# Patient Record
Sex: Female | Born: 1975 | Race: Black or African American | Hispanic: No | Marital: Single | Smoking: Current every day smoker
Health system: Southern US, Community
[De-identification: ages and names within clinical notes are randomized; demographics above are authoritative.]

## PROBLEM LIST (undated history)

## (undated) DIAGNOSIS — J020 Streptococcal pharyngitis: Secondary | ICD-10-CM

## (undated) DIAGNOSIS — R51 Headache: Secondary | ICD-10-CM

## (undated) DIAGNOSIS — D649 Anemia, unspecified: Secondary | ICD-10-CM

## (undated) DIAGNOSIS — Z8619 Personal history of other infectious and parasitic diseases: Secondary | ICD-10-CM

## (undated) DIAGNOSIS — R519 Headache, unspecified: Secondary | ICD-10-CM

## (undated) HISTORY — PX: GASTRIC BYPASS: SHX52

## (undated) HISTORY — DX: Headache, unspecified: R51.9

## (undated) HISTORY — PX: TONSILLECTOMY: SUR1361

## (undated) HISTORY — DX: Anemia, unspecified: D64.9

## (undated) HISTORY — DX: Headache: R51

## (undated) HISTORY — PX: OVARIAN CYST SURGERY: SHX726

## (undated) HISTORY — DX: Personal history of other infectious and parasitic diseases: Z86.19

---

## 1995-07-05 HISTORY — PX: ECTOPIC PREGNANCY SURGERY: SHX613

## 2004-07-04 HISTORY — PX: CHOLECYSTECTOMY: SHX55

## 2005-04-21 HISTORY — PX: GASTRIC BYPASS: SHX52

## 2006-01-10 ENCOUNTER — Emergency Department (HOSPITAL_COMMUNITY): Admission: EM | Admit: 2006-01-10 | Discharge: 2006-01-10 | Payer: Self-pay | Admitting: Emergency Medicine

## 2006-03-14 ENCOUNTER — Emergency Department (HOSPITAL_COMMUNITY): Admission: EM | Admit: 2006-03-14 | Discharge: 2006-03-14 | Payer: Self-pay | Admitting: Emergency Medicine

## 2007-05-19 ENCOUNTER — Emergency Department (HOSPITAL_COMMUNITY): Admission: EM | Admit: 2007-05-19 | Discharge: 2007-05-19 | Payer: Self-pay | Admitting: Family Medicine

## 2007-07-02 ENCOUNTER — Emergency Department (HOSPITAL_COMMUNITY): Admission: EM | Admit: 2007-07-02 | Discharge: 2007-07-02 | Payer: Self-pay | Admitting: Emergency Medicine

## 2007-11-06 ENCOUNTER — Emergency Department (HOSPITAL_COMMUNITY): Admission: EM | Admit: 2007-11-06 | Discharge: 2007-11-06 | Payer: Self-pay | Admitting: Family Medicine

## 2007-11-22 ENCOUNTER — Emergency Department (HOSPITAL_COMMUNITY): Admission: EM | Admit: 2007-11-22 | Discharge: 2007-11-22 | Payer: Self-pay | Admitting: Family Medicine

## 2007-12-18 ENCOUNTER — Inpatient Hospital Stay (HOSPITAL_COMMUNITY): Admission: AD | Admit: 2007-12-18 | Discharge: 2007-12-18 | Payer: Self-pay | Admitting: Family Medicine

## 2008-01-08 ENCOUNTER — Inpatient Hospital Stay (HOSPITAL_COMMUNITY): Admission: AD | Admit: 2008-01-08 | Discharge: 2008-01-08 | Payer: Self-pay | Admitting: Obstetrics & Gynecology

## 2008-02-06 ENCOUNTER — Inpatient Hospital Stay (HOSPITAL_COMMUNITY): Admission: AD | Admit: 2008-02-06 | Discharge: 2008-02-06 | Payer: Self-pay | Admitting: Obstetrics and Gynecology

## 2008-04-24 ENCOUNTER — Ambulatory Visit: Payer: Self-pay | Admitting: Hematology and Oncology

## 2008-04-30 LAB — CBC & DIFF AND RETIC
Eosinophils Absolute: 0.3 10*3/uL (ref 0.0–0.5)
HCT: 26 % — ABNORMAL LOW (ref 34.8–46.6)
LYMPH%: 20.2 % (ref 14.0–48.0)
MCHC: 33.4 g/dL (ref 32.0–36.0)
MCV: 84.4 fL (ref 81.0–101.0)
MONO#: 0.5 10*3/uL (ref 0.1–0.9)
MONO%: 5.4 % (ref 0.0–13.0)
NEUT#: 6.4 10*3/uL (ref 1.5–6.5)
NEUT%: 70.4 % (ref 39.6–76.8)
Platelets: 295 10*3/uL (ref 145–400)
WBC: 9.1 10*3/uL (ref 3.9–10.0)

## 2008-04-30 LAB — URINALYSIS, MICROSCOPIC - CHCC
Bilirubin (Urine): NEGATIVE
Glucose: NEGATIVE g/dL
Ketones: NEGATIVE mg/dL
Leukocyte Esterase: NEGATIVE
Nitrite: NEGATIVE
Protein: 30 mg/dL
Specific Gravity, Urine: 1.03 (ref 1.003–1.035)

## 2008-04-30 LAB — MORPHOLOGY

## 2008-05-05 ENCOUNTER — Observation Stay (HOSPITAL_COMMUNITY): Admission: AD | Admit: 2008-05-05 | Discharge: 2008-05-05 | Payer: Self-pay | Admitting: Obstetrics and Gynecology

## 2008-05-05 LAB — COMPREHENSIVE METABOLIC PANEL
Albumin: 3.8 g/dL (ref 3.5–5.2)
BUN: 13 mg/dL (ref 6–23)
CO2: 20 mEq/L (ref 19–32)
Calcium: 8.9 mg/dL (ref 8.4–10.5)
Glucose, Bld: 109 mg/dL — ABNORMAL HIGH (ref 70–99)
Potassium: 4.1 mEq/L (ref 3.5–5.3)
Sodium: 136 mEq/L (ref 135–145)
Total Protein: 7 g/dL (ref 6.0–8.3)

## 2008-05-05 LAB — LACTATE DEHYDROGENASE: LDH: 136 U/L (ref 94–250)

## 2008-05-05 LAB — HAPTOGLOBIN: Haptoglobin: 177 mg/dL (ref 16–200)

## 2008-05-05 LAB — IRON AND TIBC: Iron: 66 ug/dL (ref 42–145)

## 2008-05-05 LAB — DIRECT ANTIGLOBULIN TEST (NOT AT ARMC): DAT (Complement): NEGATIVE

## 2008-05-06 ENCOUNTER — Inpatient Hospital Stay (HOSPITAL_COMMUNITY): Admission: AD | Admit: 2008-05-06 | Discharge: 2008-05-06 | Payer: Self-pay | Admitting: Obstetrics & Gynecology

## 2008-05-07 ENCOUNTER — Inpatient Hospital Stay (HOSPITAL_COMMUNITY): Admission: AD | Admit: 2008-05-07 | Discharge: 2008-05-07 | Payer: Self-pay | Admitting: Obstetrics and Gynecology

## 2008-05-19 ENCOUNTER — Inpatient Hospital Stay (HOSPITAL_COMMUNITY): Admission: AD | Admit: 2008-05-19 | Discharge: 2008-05-19 | Payer: Self-pay | Admitting: Obstetrics and Gynecology

## 2008-05-22 ENCOUNTER — Ambulatory Visit (HOSPITAL_COMMUNITY): Admission: RE | Admit: 2008-05-22 | Discharge: 2008-05-22 | Payer: Self-pay | Admitting: Obstetrics and Gynecology

## 2008-05-26 ENCOUNTER — Inpatient Hospital Stay (HOSPITAL_COMMUNITY): Admission: AD | Admit: 2008-05-26 | Discharge: 2008-05-26 | Payer: Self-pay | Admitting: Obstetrics and Gynecology

## 2008-05-26 ENCOUNTER — Encounter: Payer: Self-pay | Admitting: Obstetrics and Gynecology

## 2008-06-05 ENCOUNTER — Ambulatory Visit (HOSPITAL_COMMUNITY): Admission: RE | Admit: 2008-06-05 | Discharge: 2008-06-05 | Payer: Self-pay | Admitting: Obstetrics and Gynecology

## 2008-06-13 ENCOUNTER — Ambulatory Visit (HOSPITAL_COMMUNITY): Admission: RE | Admit: 2008-06-13 | Discharge: 2008-06-13 | Payer: Self-pay | Admitting: Obstetrics and Gynecology

## 2008-06-16 ENCOUNTER — Ambulatory Visit (HOSPITAL_COMMUNITY): Admission: RE | Admit: 2008-06-16 | Discharge: 2008-06-16 | Payer: Self-pay | Admitting: Obstetrics and Gynecology

## 2008-06-19 ENCOUNTER — Ambulatory Visit (HOSPITAL_COMMUNITY): Admission: RE | Admit: 2008-06-19 | Discharge: 2008-06-19 | Payer: Self-pay | Admitting: Obstetrics and Gynecology

## 2008-06-24 ENCOUNTER — Inpatient Hospital Stay (HOSPITAL_COMMUNITY): Admission: AD | Admit: 2008-06-24 | Discharge: 2008-06-27 | Payer: Self-pay | Admitting: Obstetrics & Gynecology

## 2008-06-24 ENCOUNTER — Ambulatory Visit: Payer: Self-pay | Admitting: Hematology and Oncology

## 2008-06-28 ENCOUNTER — Encounter: Admission: RE | Admit: 2008-06-28 | Discharge: 2008-07-28 | Payer: Self-pay | Admitting: Obstetrics & Gynecology

## 2008-07-29 ENCOUNTER — Encounter: Admission: RE | Admit: 2008-07-29 | Discharge: 2008-08-06 | Payer: Self-pay | Admitting: Obstetrics & Gynecology

## 2008-12-11 IMAGING — CR DG CERVICAL SPINE COMPLETE 4+V
8 series · 8 of 8 positions shown · non-contrast
Comparison: None

CLINICAL DATA: Trauma

CERVICAL SPINE - 6 VIEW

[w c-spine lat]
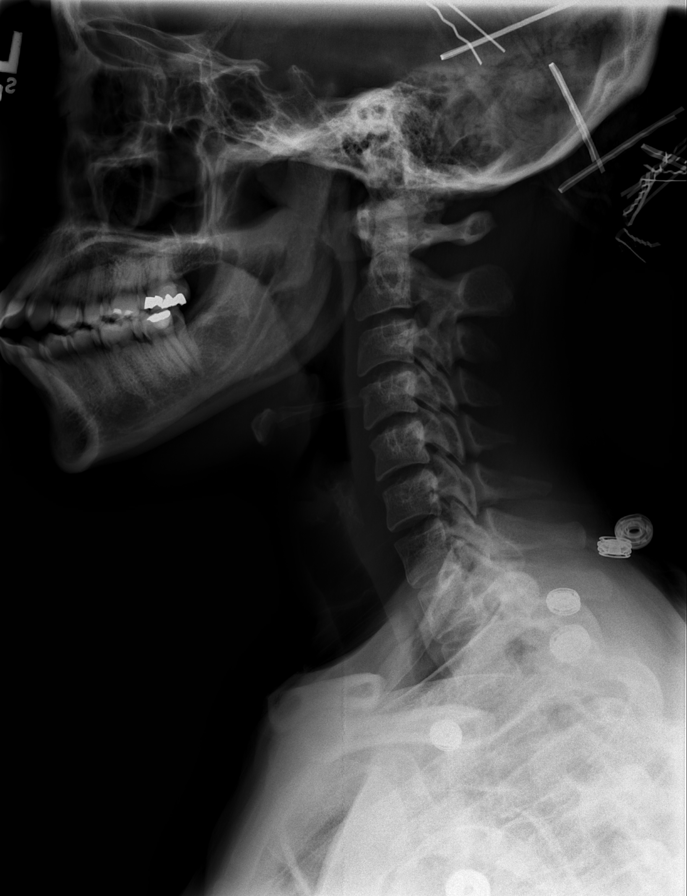

[w c-spine oblique (1 of 3)]
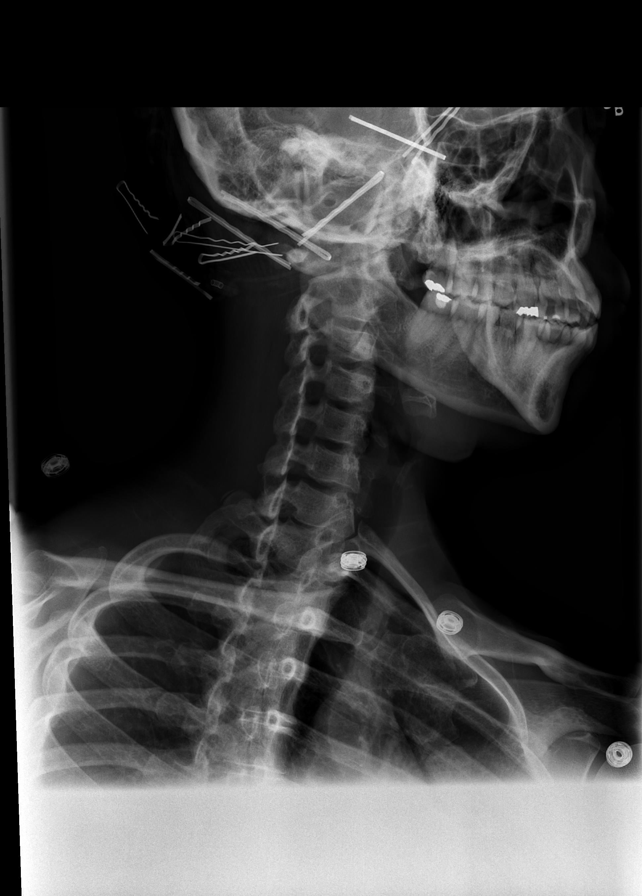

[w c-spine oblique (2 of 3)]
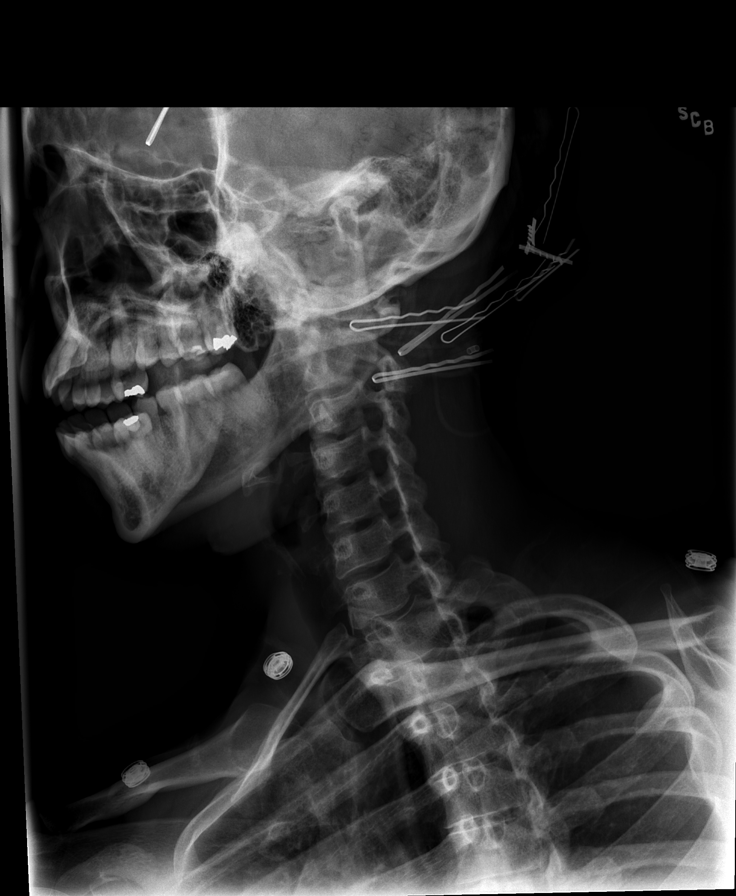

[w c-spine oblique (3 of 3)]
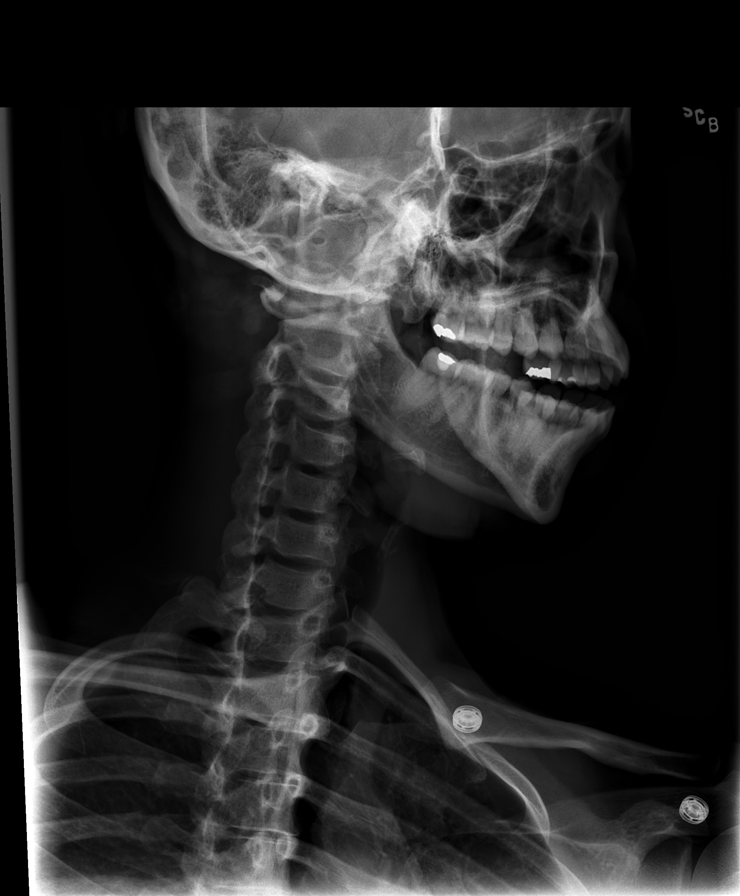

[w c-spine a.p. (1 of 2)]
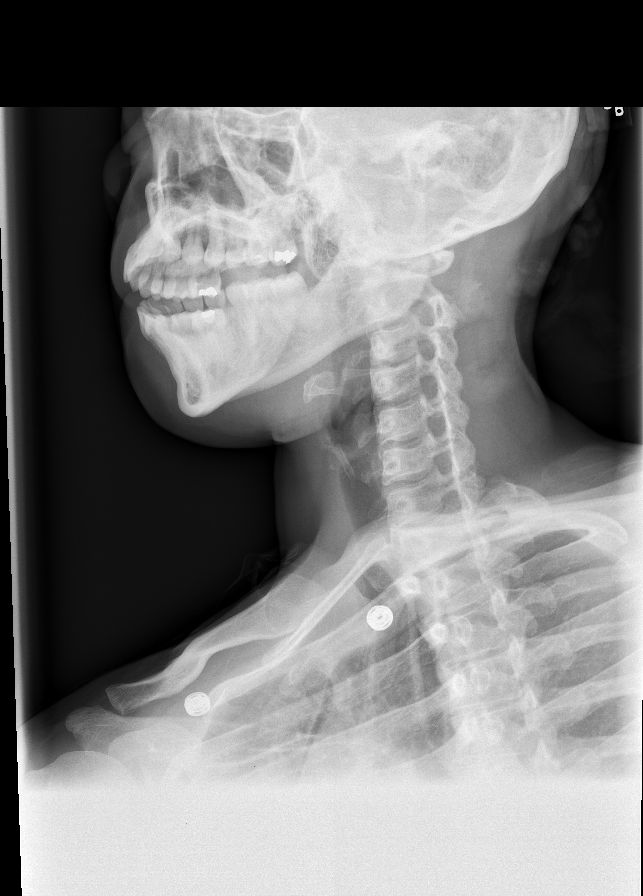

[w c-spine a.p. (2 of 2)]
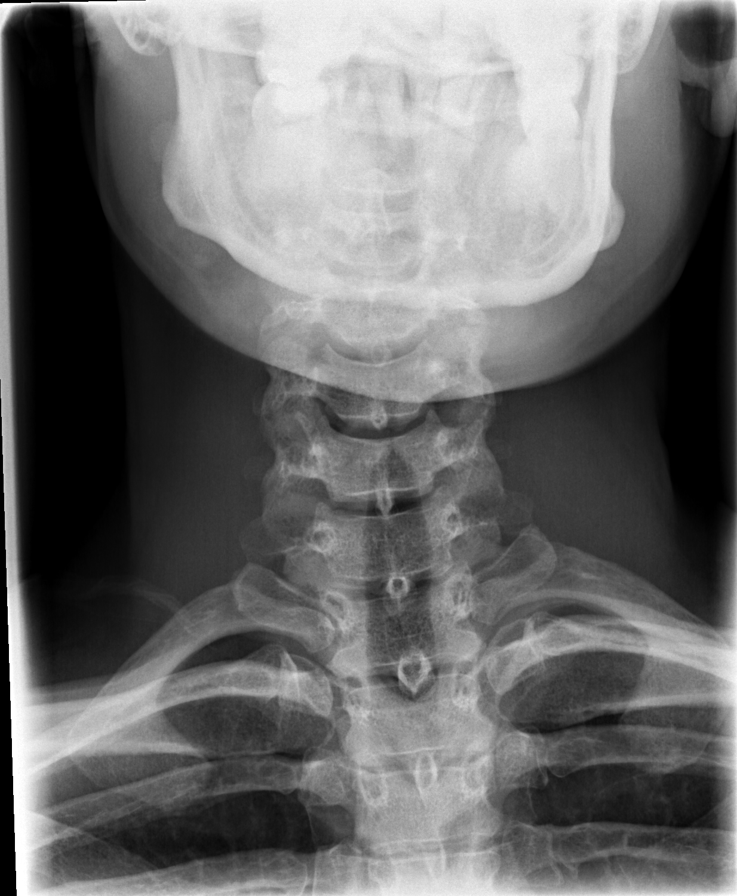

[w c-spine odontoid (1 of 2)]
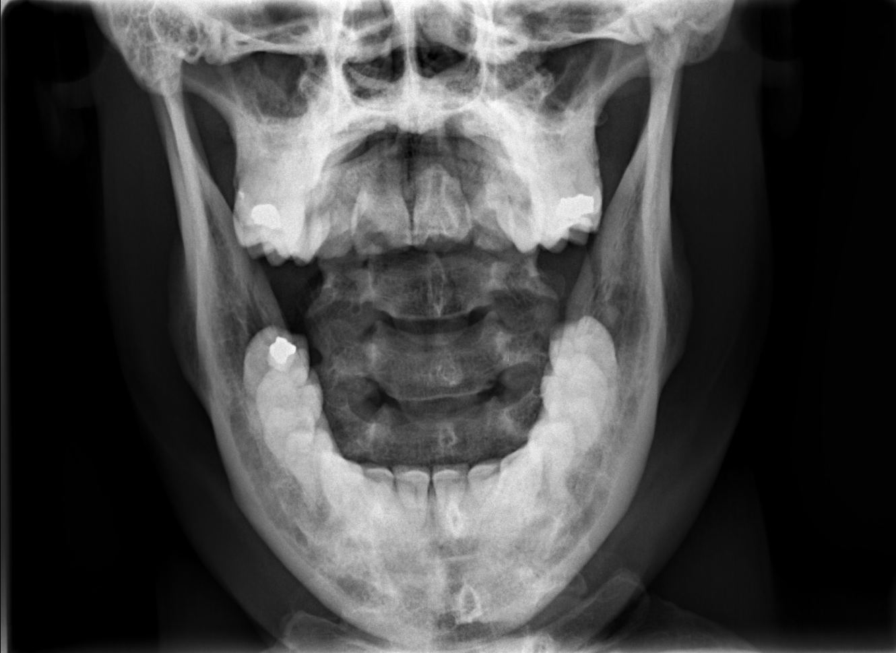

[w c-spine odontoid (2 of 2)]
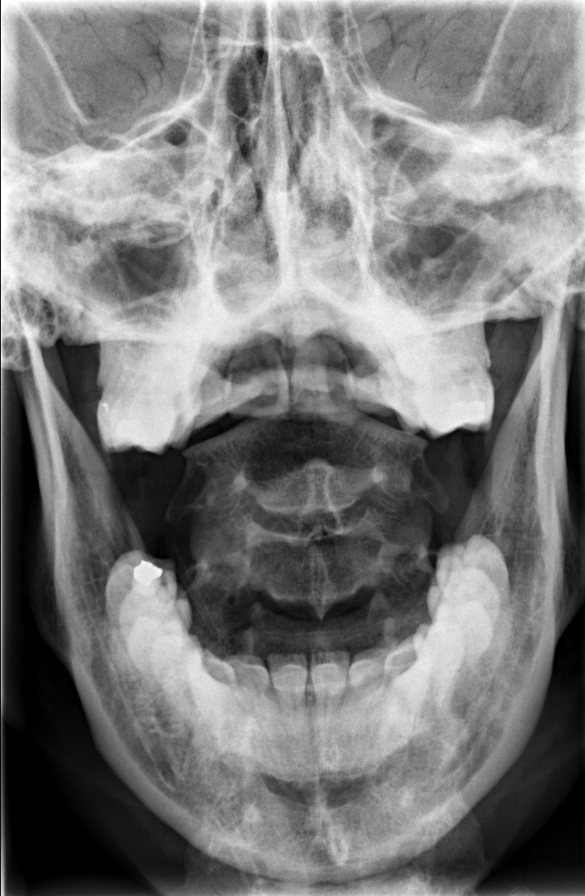

[8 of 8 positions shown; findings below may reference images not displayed]

FINDINGS: Lateral view images through the top of T1. Prevertebral soft tissues
are normal. Maintenance of vertebral body height and alignment. Facets are well
aligned. Body of C-2 intact. Odontoid process partially obscured on both the
open mouth views. No displaced odontoid process fracture.

IMPRESSION

1. Limited evaluation of the odontoid process.
2. No evidence of fracture or subluxation throughout the remainder of the
cervical spine.

## 2009-05-30 ENCOUNTER — Emergency Department (HOSPITAL_COMMUNITY): Admission: EM | Admit: 2009-05-30 | Discharge: 2009-05-30 | Payer: Self-pay | Admitting: Family Medicine

## 2009-06-17 ENCOUNTER — Emergency Department (HOSPITAL_COMMUNITY): Admission: EM | Admit: 2009-06-17 | Discharge: 2009-06-17 | Payer: Self-pay | Admitting: Family Medicine

## 2010-01-11 ENCOUNTER — Ambulatory Visit (HOSPITAL_COMMUNITY): Admission: RE | Admit: 2010-01-11 | Discharge: 2010-01-11 | Payer: Self-pay | Admitting: Obstetrics and Gynecology

## 2010-06-12 ENCOUNTER — Emergency Department (HOSPITAL_COMMUNITY)
Admission: EM | Admit: 2010-06-12 | Discharge: 2010-06-12 | Payer: Self-pay | Source: Home / Self Care | Admitting: Physician Assistant

## 2010-09-19 LAB — PREGNANCY, URINE: Preg Test, Ur: NEGATIVE

## 2010-09-19 LAB — CBC
Hemoglobin: 10.2 g/dL — ABNORMAL LOW (ref 12.0–15.0)
Platelets: 310 10*3/uL (ref 150–400)

## 2010-09-19 LAB — SURGICAL PCR SCREEN: MRSA, PCR: NEGATIVE

## 2011-03-30 LAB — POCT PREGNANCY, URINE
Operator id: 247071
Preg Test, Ur: POSITIVE

## 2011-03-31 LAB — URINALYSIS, ROUTINE W REFLEX MICROSCOPIC
Bilirubin Urine: NEGATIVE
Glucose, UA: NEGATIVE
Ketones, ur: 15 — AB
Ketones, ur: 15 — AB
Nitrite: NEGATIVE
Nitrite: NEGATIVE
Protein, ur: NEGATIVE
Urobilinogen, UA: 1
pH: 6

## 2011-03-31 LAB — CBC
Hemoglobin: 9.7 — ABNORMAL LOW
Platelets: 365
RDW: 17.7 — ABNORMAL HIGH

## 2011-03-31 LAB — URINE MICROSCOPIC-ADD ON

## 2011-03-31 LAB — ABO/RH: ABO/RH(D): A POS

## 2011-03-31 LAB — WET PREP, GENITAL: Yeast Wet Prep HPF POC: NONE SEEN

## 2011-04-01 LAB — CBC
Hemoglobin: 8.8 — ABNORMAL LOW
MCHC: 33.5
RBC: 3.07 — ABNORMAL LOW
WBC: 9.6

## 2011-04-08 LAB — CBC
HCT: 24.6 % — ABNORMAL LOW (ref 36.0–46.0)
HCT: 27.8 % — ABNORMAL LOW (ref 36.0–46.0)
Hemoglobin: 9.2 g/dL — ABNORMAL LOW (ref 12.0–15.0)
MCHC: 33 g/dL (ref 30.0–36.0)
MCV: 88.8 fL (ref 78.0–100.0)
MCV: 89.8 fL (ref 78.0–100.0)
Platelets: 230 10*3/uL (ref 150–400)
RBC: 3.1 MIL/uL — ABNORMAL LOW (ref 3.87–5.11)
RDW: 17.1 % — ABNORMAL HIGH (ref 11.5–15.5)

## 2011-04-08 LAB — I-STAT 8, (EC8 V) (CONVERTED LAB)
Acid-base deficit: 2
Glucose, Bld: 85
TCO2: 25
pCO2, Ven: 43.2 — ABNORMAL LOW
pH, Ven: 7.352 — ABNORMAL HIGH

## 2011-04-08 LAB — POCT I-STAT CREATININE: Operator id: 247071

## 2011-04-12 LAB — POCT URINE HEMOGLOBIN: Hgb urine dipstick: NEGATIVE

## 2011-04-12 LAB — POCT PREGNANCY, URINE
Operator id: 257131
Preg Test, Ur: NEGATIVE

## 2012-05-02 ENCOUNTER — Encounter: Payer: Self-pay | Admitting: Family

## 2012-05-02 ENCOUNTER — Ambulatory Visit (INDEPENDENT_AMBULATORY_CARE_PROVIDER_SITE_OTHER): Payer: Managed Care, Other (non HMO) | Admitting: Family

## 2012-05-02 VITALS — BP 122/62 | HR 75 | Temp 98.6°F | Resp 16 | Ht 66.25 in | Wt 170.0 lb

## 2012-05-02 DIAGNOSIS — M2550 Pain in unspecified joint: Secondary | ICD-10-CM

## 2012-05-02 DIAGNOSIS — D509 Iron deficiency anemia, unspecified: Secondary | ICD-10-CM | POA: Insufficient documentation

## 2012-05-02 DIAGNOSIS — D649 Anemia, unspecified: Secondary | ICD-10-CM

## 2012-05-02 DIAGNOSIS — E039 Hypothyroidism, unspecified: Secondary | ICD-10-CM

## 2012-05-02 DIAGNOSIS — R202 Paresthesia of skin: Secondary | ICD-10-CM

## 2012-05-02 DIAGNOSIS — R531 Weakness: Secondary | ICD-10-CM

## 2012-05-02 DIAGNOSIS — R5383 Other fatigue: Secondary | ICD-10-CM | POA: Insufficient documentation

## 2012-05-02 DIAGNOSIS — Z0000 Encounter for general adult medical examination without abnormal findings: Secondary | ICD-10-CM

## 2012-05-02 DIAGNOSIS — R209 Unspecified disturbances of skin sensation: Secondary | ICD-10-CM

## 2012-05-02 NOTE — Progress Notes (Signed)
Subjective:    Patient ID: Robin Kent, female    DOB: Dec 11, 1975, 36 y.o.   MRN: [id number]  HPI  New to establish care. She has had 40 pound weight gain in last 3 years.  She moved here 5 yrs ago from [place] and has not had a pcp.  Dr. Cherly Hensen is OB/GYN.    Hx of Iron deficiency anemia- she has received iv iron during pregnancy. Reports that she has been iron deficient since she was a baby.    She has chief complaint of weakness in her legs.  Reports that her legs will give out from her.  She reports that she has also had migraines.    Reports fatigued all the time. + insomnia. 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   Has not been diagnosed with depression/anxiety.  Reports a lot of financial stress at home but denies any problems with depression or anxiety   Review of Systems  Constitutional: Positive for unexpected weight change.  HENT: Negative for hearing loss and congestion.   Eyes: Negative for visual disturbance.  Respiratory: Negative for cough.   Gastrointestinal: Negative for blood in stool.  Genitourinary: Negative for dysuria and frequency.       + stress incontinence.    Musculoskeletal:       Knees "crack"  Skin: Negative for rash.  Neurological: Positive for headaches.       + paresthesias in hands and feet.  Hematological: Negative for adenopathy.  Psychiatric/Behavioral:       Denies depression/anxiety   Past Medical History  Diagnosis Date  . History of chicken pox   . HA (headache)   . Anemia     History   Social History  . Marital Status: Married    Spouse Name: N/A    Number of Children: N/A  . Years of Education: N/A   Occupational History  . Not on file.   Social History Main Topics  . Smoking status: Not on file  . Smokeless tobacco: Not on file  . Alcohol Use:   . Drug Use:   . Sexually Active:    Other Topics Concern  . 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 Cholecystectomy 2006  . Gastric bypass 04/21/05  . Ectopic pregnancy surgery 1997    Family History  Problem Relation Age of Onset  . Hyperlipidemia Mother   . Hypertension Mother   . Diabetes Mother   . Hyperlipidemia Father   . Hypertension Father   . Diabetes Father   . Diabetes Maternal Grandmother   . Diabetes Maternal Grandfather   . Diabetes Paternal Grandmother   . Diabetes Paternal Grandfather     No Known Allergies  No current outpatient prescriptions on file prior to visit.    BP 122/62  Pulse 75  Temp 98.6 F (37 C) (Oral)  Resp 16  Ht 5' 6.25" (1.683 m)  Wt 170 lb (77.111 kg)  BMI 27.23 kg/m2  SpO2 99%        Objective:   Physical Exam  Constitutional: She appears well-developed and well-nourished. No distress.  HENT:  Head: Normocephalic and atraumatic.  Eyes:       Pale conjunctiva  Neck: No thyromegaly present.  Cardiovascular: Normal rate and regular rhythm.   No  murmur heard. Pulmonary/Chest: Effort normal and breath sounds normal. No respiratory distress. She has no wheezes. She has no rales. She exhibits no tenderness.  Abdominal: Soft. Bowel sounds are normal.  Musculoskeletal: She exhibits no edema.  Lymphadenopathy:    She has no cervical adenopathy.  Skin: Skin is warm and dry.  Psychiatric: She has a normal mood and affect. Her behavior is normal. Judgment and thought content normal.          Assessment & Plan:

## 2012-05-02 NOTE — Assessment & Plan Note (Addendum)
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  We discussed importance of stopping pica. Large intake of flour is likely cause for her weight gain.  She tells me she has never tried iron tabs in the past for her anemia.  Will see how CBC and iron levels look.

## 2012-05-02 NOTE — Assessment & Plan Note (Signed)
Check b12 and folate. °

## 2012-05-02 NOTE — Assessment & Plan Note (Signed)
Suspect that this is likely due to iron deficiency anemia.  Will obtain CBC and serum iron leve.

## 2012-05-02 NOTE — Assessment & Plan Note (Signed)
Will obtain RA, ESR, ANA

## 2012-05-02 NOTE — Patient Instructions (Signed)
Please complete your blood work prior to leaving. Follow up in 1 month for a fasting physical.

## 2012-05-04 ENCOUNTER — Telehealth: Payer: Self-pay | Admitting: Family

## 2012-05-04 ENCOUNTER — Encounter: Payer: Self-pay | Admitting: Family

## 2012-05-04 NOTE — Telephone Encounter (Signed)
Results received from [facility] and reviewed by Dr Rodena Medin. Advised that anemia appears chronic and instructed pt to start ferrous sulfate 325mg  three times daily. Pt should be evaluated in the ER if she has or does develop: chest pain, sob, syncope, dizziness, dark stools or abdominal pain. Notified pt. She denies above symptoms except occasional dizziness. Pt instructed to be evaluated in the ER if symptoms become severe. Discussed possibility of referral to specialist for further work up and treatment of anemia. Pt will start oral iron supplement over the weekend and await further instructions from [place] on Monday.

## 2012-05-04 NOTE — Telephone Encounter (Signed)
Patient is requesting last lab results 

## 2012-05-04 NOTE — Telephone Encounter (Signed)
Spoke to [place] at [facility] [phone]) and verified that labs just resulted today. They will fax results to [place]. Need to let pt know that it will be Monday before she is notified of results / instructions as Provider is out of the office today.  Left message on home # to return my call.

## 2012-05-07 NOTE — Telephone Encounter (Signed)
Lab results reviewed.  Left message requesting that pt return my call.

## 2012-05-08 NOTE — Telephone Encounter (Signed)
Spoke with patient and informed her that Robin Kent wanted her to schedule a 1 month follow up. Patient states that she will have to call back to schedule this appointment.

## 2012-05-08 NOTE — Telephone Encounter (Signed)
Spoke to pt re: anemia. She reports that she has started iron supplement. Having some gas since starting. Reports heavy periods x 5 days.  Prior to starting iron denied hx of dark or bloody stools. I have asked pt to continue iron and plan to follow up in 1 month.  Judeth Cornfield- could you pls contact pt and schedule a 1 month follow up? thanks

## 2012-05-17 ENCOUNTER — Encounter: Payer: Self-pay | Admitting: Family

## 2012-10-21 ENCOUNTER — Emergency Department (INDEPENDENT_AMBULATORY_CARE_PROVIDER_SITE_OTHER)
Admission: EM | Admit: 2012-10-21 | Discharge: 2012-10-21 | Disposition: A | Discharge status: Home / Self Care | Payer: Self-pay | Source: Home / Self Care | Attending: Family Medicine | Admitting: Family Medicine

## 2012-10-21 ENCOUNTER — Encounter (HOSPITAL_COMMUNITY): Payer: Self-pay | Admitting: *Deleted

## 2012-10-21 DIAGNOSIS — J069 Acute upper respiratory infection, unspecified: Secondary | ICD-10-CM

## 2012-10-21 MED ORDER — GUAIFENESIN-CODEINE 100-10 MG/5ML PO SYRP: 5.0000 mL | ORAL_SOLUTION | Freq: Three times a day (TID) | ORAL | Status: DC | PRN

## 2012-10-21 MED ORDER — CETIRIZINE-PSEUDOEPHEDRINE ER 5-120 MG PO TB12: 1.0000 | ORAL_TABLET | Freq: Two times a day (BID) | ORAL | Status: DC | PRN

## 2012-10-21 MED ORDER — AMOXICILLIN 500 MG PO CAPS: 500.0000 mg | ORAL_CAPSULE | Freq: Three times a day (TID) | ORAL | Status: DC

## 2012-10-21 MED ORDER — IBUPROFEN 600 MG PO TABS: 600.0000 mg | ORAL_TABLET | Freq: Three times a day (TID) | ORAL | Status: DC | PRN

## 2012-10-21 NOTE — ED Notes (Addendum)
patient complains of sore throat and head congestion with drainage and fatigue x 2 days. Denies fever/chills, nausea/vomiting, diarrhea. Patient requests generic prescription if possible due to lack of insurance.

## 2012-10-21 NOTE — ED Provider Notes (Signed)
History     CSN: [id number]  Arrival date & time 10/21/12  1534   First MD Initiated Contact with Patient 10/21/12 1543      Chief Complaint  Patient presents with  . Sore Throat    (Consider location/radiation/quality/duration/timing/severity/associated sxs/prior treatment) HPI Comments: 37 year old smoker female with history of recurrent pharyngitis. Here complaining of sore throat, nasal congestion and nonproductive cough for 2 days. Patient and complaining of left side enlarged neck side lymph node that is tender. She denies fever or chills. Denies headache or abdominal pain. No rashes. Patient states she has "strep throat again". Denies chest pain, shortness of breath or difficulty breathing.   Past Medical History  Diagnosis Date  . History of chicken pox   . HA (headache)   . Anemia     Past Surgical History  Procedure Laterality Date  . Cholecystectomy  2006  . 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 Ectopic pregnancy surgery  1997    Family History  Problem Relation Age of Onset  . Hyperlipidemia Mother   . Hypertension Mother   . Diabetes Mother   . Hyperlipidemia Father   . Hypertension Father   . Diabetes Father   . Diabetes Maternal Grandmother   . Diabetes Maternal Grandfather   . Diabetes Paternal Grandmother   . Diabetes Paternal Grandfather   . HIV Father     History  Substance Use Topics  . Smoking status: Not on file  . Smokeless tobacco: Not on file  . Alcohol Use:     OB History   Grav Para Term Preterm Abortions TAB SAB Ect Mult Living                  Review of Systems  Constitutional: Negative for fever, chills, diaphoresis, appetite change and fatigue.  HENT: Positive for congestion, sore throat, rhinorrhea, trouble swallowing and neck pain. Negative for ear pain and voice change.   Eyes: Negative for discharge.  Respiratory: Positive for cough. Negative for shortness of breath and wheezing.   Cardiovascular: Negative for chest pain.   Gastrointestinal: Negative for nausea, vomiting and abdominal pain.  Skin: Negative for rash.  Neurological: Negative for dizziness and headaches.    Allergies  Review of patient's allergies indicates no known allergies.  Home Medications   Current Outpatient Rx  Name  Route  Sig  Dispense  Refill  . amoxicillin (AMOXIL) 500 MG capsule   Oral   Take 1 capsule (500 mg total) by mouth 3 (three) times daily.   21 capsule   0   . cetirizine-pseudoephedrine (ZYRTEC-D) 5-120 MG per tablet   Oral   Take 1 tablet by mouth 2 (two) times daily as needed for allergies or rhinitis.   30 tablet   0   . guaiFENesin-codeine (ROBITUSSIN AC) 100-10 MG/5ML syrup   Oral   Take 5 mLs by mouth 3 (three) times daily as needed for cough.   120 mL   0   . ibuprofen (ADVIL,MOTRIN) 600 MG tablet   Oral   Take 1 tablet (600 mg total) by mouth every 8 (eight) hours as needed for pain or fever.   30 tablet   0     BP 118/68  Pulse 87  Temp(Src) 98.3 F (36.8 C) (Oral)  Resp 20  SpO2 100%  LMP 10/18/2012  Physical Exam  Nursing note and vitals reviewed. Constitutional: She is oriented to person, place, and time. She appears well-developed and well-nourished. No distress.  HENT:  Head:  Normocephalic and atraumatic.  Nasal Congestion with erythema and swelling of nasal turbinates, clear rhinorrhea. Significant pharyngeal erythema no exudates. No uvula deviation. No trismus. TM's with increased vascular markings and some dullness bilaterally no swelling or bulging  Neck: Neck supple. No thyromegaly present.  Cardiovascular: Normal rate, regular rhythm and normal heart sounds.   Pulmonary/Chest: Effort normal and breath sounds normal.  Abdominal: Soft. There is no tenderness.  No HSM  Lymphadenopathy:    She has cervical adenopathy.  Neurological: She is alert and oriented to person, place, and time.  Skin: No rash noted. She is not diaphoretic.    ED Course  Procedures (including  critical care time)  Labs Reviewed  POCT RAPID STREP A ([facility] CARE ONLY)   No results found.   1. Upper respiratory infection       MDM  Prescribed ibuprofen, guaifenesin/codeine, cetirizine/pseudoephedrine. As per patient request provided a hold prescription for amoxicillin to feel only if  or no improving worsening symptoms after 72 hours. Supportive care and red flags that should prompt return to medical attention discussed with patient and provided in writing.        Sharin Grave, MD 10/23/12 [phone]

## 2013-01-10 ENCOUNTER — Encounter (HOSPITAL_COMMUNITY): Payer: Self-pay | Admitting: Cardiology

## 2013-01-10 ENCOUNTER — Emergency Department (HOSPITAL_COMMUNITY)
Admission: EM | Admit: 2013-01-10 | Discharge: 2013-01-10 | Disposition: A | Discharge status: Home / Self Care | Payer: No Typology Code available for payment source | Attending: Emergency Medicine | Admitting: Emergency Medicine

## 2013-01-10 DIAGNOSIS — Y92410 Unspecified street and highway as the place of occurrence of the external cause: Secondary | ICD-10-CM | POA: Insufficient documentation

## 2013-01-10 DIAGNOSIS — Y9389 Activity, other specified: Secondary | ICD-10-CM | POA: Insufficient documentation

## 2013-01-10 DIAGNOSIS — Z862 Personal history of diseases of the blood and blood-forming organs and certain disorders involving the immune mechanism: Secondary | ICD-10-CM | POA: Insufficient documentation

## 2013-01-10 DIAGNOSIS — Z8619 Personal history of other infectious and parasitic diseases: Secondary | ICD-10-CM | POA: Insufficient documentation

## 2013-01-10 DIAGNOSIS — S0990XA Unspecified injury of head, initial encounter: Secondary | ICD-10-CM | POA: Insufficient documentation

## 2013-01-10 DIAGNOSIS — F17200 Nicotine dependence, unspecified, uncomplicated: Secondary | ICD-10-CM | POA: Insufficient documentation

## 2013-01-10 MED ORDER — IBUPROFEN 800 MG PO TABS: 800.0000 mg | ORAL_TABLET | Freq: Three times a day (TID) | ORAL | Status: DC | PRN

## 2013-01-10 MED ORDER — METHOCARBAMOL 500 MG PO TABS
500.0000 mg | ORAL_TABLET | Freq: Two times a day (BID) | ORAL | Status: DC
Start: 2013-01-10 — End: 2013-08-18
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
## 2013-01-12 NOTE — ED Provider Notes (Signed)
Medical screening examination/treatment/procedure(s) were performed by non-physician practitioner and as supervising physician I was immediately available for consultation/collaboration.  Quasim Doyon R. Madeline Pho, MD 01/12/13 1358 

## 2013-05-19 ENCOUNTER — Emergency Department (INDEPENDENT_AMBULATORY_CARE_PROVIDER_SITE_OTHER)
Admission: EM | Admit: 2013-05-19 | Discharge: 2013-05-19 | Disposition: A | Discharge status: Home / Self Care | Payer: Self-pay | Source: Home / Self Care | Attending: Emergency Medicine | Admitting: Emergency Medicine

## 2013-05-19 ENCOUNTER — Encounter (HOSPITAL_COMMUNITY): Payer: Self-pay | Admitting: Emergency Medicine

## 2013-05-19 DIAGNOSIS — J020 Streptococcal pharyngitis: Secondary | ICD-10-CM

## 2013-05-19 HISTORY — DX: Streptococcal pharyngitis: J02.0

## 2013-05-19 MED ORDER — AMOXICILLIN 500 MG PO CAPS: 500.0000 mg | ORAL_CAPSULE | Freq: Three times a day (TID) | ORAL | Status: DC

## 2013-05-19 MED ORDER — HYDROCODONE-ACETAMINOPHEN 5-325 MG PO TABS: ORAL_TABLET | ORAL | Status: DC

## 2013-05-19 NOTE — ED Provider Notes (Signed)
Chief Complaint:   Chief Complaint  Patient presents with  . Sore Throat  . Fever    History of Present Illness:   Robin Kent is a 37 year old female who has had a three-day history of fever of up to 101, chills, aches, cough productive yellow sputum, chest tightness, right ear pain, nasal congestion with yellow drainage, and sore throat with white spots on her tonsils. She has possibly been exposed to strep recently and also has a personal history of strep in the past. She denies any GI symptoms.  Review of Systems:  Other than as noted above, the patient denies any of the following symptoms. Systemic:  No fever, chills, sweats, fatigue, myalgias, headache, or anorexia. Eye:  No redness, pain or drainage. ENT:  No earache, ear congestion, nasal congestion, sneezing, rhinorrhea, sinus pressure, sinus pain, or post nasal drip. Lungs:  No cough, sputum production, wheezing, shortness of breath, or chest pain. GI:  No abdominal pain, nausea, vomiting, or diarrhea. Skin:  No rash or itching.  PMFSH:  Past medical history, family history, social history, meds, allergies, and nurse's notes were reviewed.  She has possibly been exposed to strep.  She has a prior history of strep in the past.  She smokes a half pack of cigarettes a day.  Physical Exam:   Vital signs:  BP 110/64  Pulse 75  Temp(Src) 98.5 F (36.9 C) (Oral)  Resp 18  SpO2 100%  LMP 05/12/2013 General:  Alert, in no distress. Eye:  No conjunctival injection or drainage. Lids were normal. ENT:  TMs and canals were normal, without erythema or inflammation.  Nasal mucosa was clear and uncongested, without drainage.  Mucous membranes were moist.  Exam of pharynx reveals tonsils to be enlarged and red with spots of white exudate.  There were no oral ulcerations or lesions. There was no bulging of the tonsillar pillars, and the uvula was midline. Neck:  Supple, no adenopathy, tenderness or mass. Lungs:  No respiratory distress.   Lungs were clear to auscultation, without wheezes, rales or rhonchi.  Breath sounds were clear and equal bilaterally.  Heart:  Regular rhythm, without gallops, murmers or rubs. Skin:  Clear, warm, and dry, without rash or lesions.  Labs:   Results for orders placed during the hospital encounter of 05/19/13  POCT RAPID STREP A ([facility] ONLY)      Result Value Range   Streptococcus, Group A Screen (Direct) POSITIVE (*) NEGATIVE    Assessment:  The encounter diagnosis was Strep throat.  There is no evidence of a peritonsillar abscess.   Plan:   1.  Meds:  The following meds were prescribed:   Discharge Medication List as of 05/19/2013  3:58 PM    START taking these medications   Details  amoxicillin (AMOXIL) 500 MG capsule Take 1 capsule (500 mg total) by mouth 3 (three) times daily., Starting 05/19/2013, Until Discontinued, Normal    HYDROcodone-acetaminophen (NORCO/VICODIN) 5-325 MG per tablet 1 to 2 tabs every 4 to 6 hours as needed for pain., Print        2.  Patient Education/Counseling:  The patient was given appropriate handouts, self care instructions, and instructed in symptomatic relief, including hot saline gargles, throat lozenges, infectious precautions, and need to trade out toothbrush.    3.  Follow up:  The patient was told to follow up if no better in 3 to 4 days, if becoming worse in any way, and given some red flag symptoms such as  difficulty swallowing or breathing which would prompt immediate return.  Follow up here as necessary.     Reuben Likes, MD 05/19/13 [phone]

## 2013-05-19 NOTE — ED Notes (Signed)
C/O fever up to 101, sore throat, cough, right earache since Friday.  States "white spots" right tonsil.

## 2013-08-18 ENCOUNTER — Emergency Department (INDEPENDENT_AMBULATORY_CARE_PROVIDER_SITE_OTHER)
Admission: EM | Admit: 2013-08-18 | Discharge: 2013-08-18 | Disposition: A | Discharge status: Home / Self Care | Payer: Self-pay | Source: Home / Self Care | Attending: Family Medicine | Admitting: Family Medicine

## 2013-08-18 ENCOUNTER — Encounter (HOSPITAL_COMMUNITY): Payer: Self-pay | Admitting: Emergency Medicine

## 2013-08-18 DIAGNOSIS — J020 Streptococcal pharyngitis: Secondary | ICD-10-CM

## 2013-08-18 DIAGNOSIS — J358 Other chronic diseases of tonsils and adenoids: Secondary | ICD-10-CM

## 2013-08-18 LAB — POCT RAPID STREP A: Streptococcus, Group A Screen (Direct): POSITIVE — AB

## 2013-08-18 MED ORDER — FLUCONAZOLE 150 MG PO TABS: 150.0000 mg | ORAL_TABLET | Freq: Once | ORAL | Status: DC

## 2013-08-18 MED ORDER — AMOXICILLIN 500 MG PO CAPS: 500.0000 mg | ORAL_CAPSULE | Freq: Three times a day (TID) | ORAL | Status: DC

## 2013-08-18 NOTE — ED Notes (Signed)
C/o sore throat States she found white chunks in her throat States throat is painful Does have history of strep

## 2013-08-18 NOTE — Discharge Instructions (Signed)
Thank you for coming in today. Take amoxicillin three times daily for 10 days.  Use up to 2 aleve twice daily for pain as needed.  Strep Throat Strep throat is an infection of the throat caused by a bacteria named Streptococcus pyogenes. Your caregiver may call the infection streptococcal "tonsillitis" or "pharyngitis" depending on whether there are signs of inflammation in the tonsils or back of the throat. Strep throat is most common in children aged 38 15 years during the cold months of the year, but it can occur in people of any age during any season. This infection is spread from person to person (contagious) through coughing, sneezing, or other close contact. SYMPTOMS   Fever or chills.  Painful, swollen, red tonsils or throat.  Pain or difficulty when swallowing.  White or yellow spots on the tonsils or throat.  Swollen, tender lymph nodes or "glands" of the neck or under the jaw.  Red rash all over the body (rare). DIAGNOSIS  Many different infections can cause the same symptoms. A test must be done to confirm the diagnosis so the right treatment can be given. A "rapid strep test" can help your caregiver make the diagnosis in a few minutes. If this test is not available, a light swab of the infected area can be used for a throat culture test. If a throat culture test is done, results are usually available in a day or two. TREATMENT  Strep throat is treated with antibiotic medicine. HOME CARE INSTRUCTIONS   Gargle with 1 tsp of salt in 1 cup of warm water, 3 4 times per day or as needed for comfort.  Family members who also have a sore throat or fever should be tested for strep throat and treated with antibiotics if they have the strep infection.  Make sure everyone in your household washes their hands well.  Do not share food, drinking cups, or personal items that could cause the infection to spread to others.  You may need to eat a soft food diet until your sore throat gets  better.  Drink enough water and fluids to keep your urine clear or pale yellow. This will help prevent dehydration.  Get plenty of rest.  Stay home from school, daycare, or work until you have been on antibiotics for 24 hours.  Only take over-the-counter or prescription medicines for pain, discomfort, or fever as directed by your caregiver.  If antibiotics are prescribed, take them as directed. Finish them even if you start to feel better. SEEK MEDICAL CARE IF:   The glands in your neck continue to enlarge.  You develop a rash, cough, or earache.  You cough up green, yellow-brown, or bloody sputum.  You have pain or discomfort not controlled by medicines.  Your problems seem to be getting worse rather than better. SEEK IMMEDIATE MEDICAL CARE IF:   You develop any new symptoms such as vomiting, severe headache, stiff or painful neck, chest pain, shortness of breath, or trouble swallowing.  You develop severe throat pain, drooling, or changes in your voice.  You develop swelling of the neck, or the skin on the neck becomes red and tender.  You have a fever.  You develop signs of dehydration, such as fatigue, dry mouth, and decreased urination.  You become increasingly sleepy, or you cannot wake up completely. Document Released: 06/17/2000 Document Revised: 06/06/2012 Document Reviewed: 08/19/2010 [facility] LLCExitCare Patient Information 2014 [place], [place].

## 2013-08-18 NOTE — ED Provider Notes (Signed)
Robin Kent is a 38 y.o. female who presents to Urgent Care today for sore throat. Patient also notes a cough congestion and runny nose. This is been present over the past several days. Patient extracted what appears to be tonsil stones the other day. She notes a foul taste and smell after extracting the tonsil stones. She has a history of frequent strep throat infections. Her son was recently sick with strep throat. No nausea vomiting diarrhea or trouble breathing.   Past Medical History  Diagnosis Date  . History of chicken pox   . HA (headache)   . Anemia   . Strep pharyngitis     multiple times   History  Substance Use Topics  . Smoking status: Current Every Day Smoker -- 0.50 packs/day  . Smokeless tobacco: Not on file  . Alcohol Use: Yes     Comment: occasional   ROS as above Medications: No current facility-administered medications for this encounter.   Current Outpatient Prescriptions  Medication Sig Dispense Refill  . amoxicillin (AMOXIL) 500 MG capsule Take 1 capsule (500 mg total) by mouth 3 (three) times daily.  30 capsule  0  . fluconazole (DIFLUCAN) 150 MG tablet Take 1 tablet (150 mg total) by mouth once.  1 tablet  1  . ibuprofen (ADVIL,MOTRIN) 800 MG tablet Take 1 tablet (800 mg total) by mouth every 8 (eight) hours as needed for pain.  30 tablet  0    Exam:  BP 113/70  Pulse 75  Temp(Src) 97.7 F (36.5 C) (Oral)  Resp 16  SpO2 100%  LMP 07/25/2013 Gen: Well NAD HEENT: EOMI,  MMM posterior pharynx is mildly erythematous. Tympanic membranes are normal bilaterally. Mild tender anterior cervical lymphadenopathy present Lungs: Normal work of breathing. CTABL Heart: RRR no MRG Abd: NABS, Soft. NT, ND Exts: Brisk capillary refill, warm and well perfused.   Results for orders placed during the hospital encounter of 08/18/13 (from the past 24 hour(s))  POCT RAPID STREP A ([facility] ONLY)     Status: Abnormal   Collection Time    08/18/13  2:18 PM   Result Value Ref Range   Streptococcus, Group A Screen (Direct) POSITIVE (*) NEGATIVE   No results found.  Assessment and Plan: 38 y.o. female with strep throat and tonsil stones. Patient has frequent strep throat infections. Plan treat with amoxicillin and referral to ENT for tonsillectomy.  Discussed warning signs or symptoms. Please see discharge instructions. Patient expresses understanding.    Rodolph BongEvan S Takina Busser, MD 08/18/13 1435

## 2013-10-24 ENCOUNTER — Other Ambulatory Visit: Payer: Self-pay | Admitting: Obstetrics and Gynecology

## 2013-10-27 ENCOUNTER — Inpatient Hospital Stay (HOSPITAL_COMMUNITY)
Admission: AD | Admit: 2013-10-27 | Discharge: 2013-10-27 | Disposition: A | Discharge status: Home / Self Care | Payer: 59 | Source: Ambulatory Visit | Attending: Obstetrics and Gynecology | Admitting: Obstetrics and Gynecology

## 2013-10-27 ENCOUNTER — Encounter (HOSPITAL_COMMUNITY): Payer: Self-pay | Admitting: *Deleted

## 2013-10-27 DIAGNOSIS — R5383 Other fatigue: Secondary | ICD-10-CM

## 2013-10-27 DIAGNOSIS — R5381 Other malaise: Secondary | ICD-10-CM | POA: Insufficient documentation

## 2013-10-27 DIAGNOSIS — D509 Iron deficiency anemia, unspecified: Secondary | ICD-10-CM | POA: Insufficient documentation

## 2013-10-27 MED ORDER — SODIUM CHLORIDE 0.9 % IV SOLN
1020.0000 mg | Freq: Once | INTRAVENOUS | Status: AC
  Administered 2013-10-27: 1020 mg via INTRAVENOUS
  Filled 2013-10-27: qty 34

## 2013-10-27 NOTE — Discharge Instructions (Signed)
° °  Iron-Rich Diet ° °An iron-rich diet contains foods that are good sources of iron. Iron is an important mineral that helps your body produce hemoglobin. Hemoglobin is a protein in red blood cells that carries oxygen to the body's tissues. Sometimes, the iron level in your blood can be low. This may be caused by: °· A lack of iron in your diet. °· Blood loss. °· Times of growth, such as during pregnancy or during a child's growth and development. °Low levels of iron can cause a decrease in the number of red blood cells. This can result in iron deficiency anemia. Iron deficiency anemia symptoms include: °· Tiredness. °· Weakness. °· Irritability. °· Increased chance of infection. °Here are some recommendations for daily iron intake: °· Males older than 38 years of age need 8 mg of iron per day. °· Women ages 19 to 50 need 18 mg of iron per day. °· Pregnant women need 27 mg of iron per day, and women who are over 19 years of age and breastfeeding need 9 mg of iron per day. °· Women over the age of 50 need 8 mg of iron per day. °SOURCES OF IRON °There are 2 types of iron that are found in food: heme iron and nonheme iron. Heme iron is absorbed by the body better than nonheme iron. Heme iron is found in meat, poultry, and fish. Nonheme iron is found in grains, beans, and vegetables. °Heme Iron Sources °Food / Iron (mg) °· Chicken liver, 3 oz (85 g)/ 10 mg °· Beef liver, 3 oz (85 g)/ 5.5 mg °· Oysters, 3 oz (85 g)/ 8 mg °· Beef, 3 oz (85 g)/ 2 to 3 mg °· Shrimp, 3 oz (85 g)/ 2.8 mg °· Turkey, 3 oz (85 g)/ 2 mg °· Chicken, 3 oz (85 g) / 1 mg °· Fish (tuna, halibut), 3 oz (85 g)/ 1 mg °· Pork, 3 oz (85 g)/ 0.9 mg °Nonheme Iron Sources °Food / Iron (mg) °· Ready-to-eat breakfast cereal, iron-fortified / 3.9 to 7 mg °· Tofu, ½ cup / 3.4 mg °· Kidney beans, ½ cup / 2.6 mg °· Baked potato with skin / 2.7 mg °· Asparagus, ½ cup / 2.2 mg °· Avocado / 2 mg °· Dried peaches, ½ cup / 1.6 mg °· Raisins, ½ cup / 1.5 mg °· Soy milk,  1 cup / 1.5 mg °· Whole-wheat bread, 1 slice / 1.2 mg °· Spinach, 1 cup / 0.8 mg °· Broccoli, ½ cup / 0.6 mg °IRON ABSORPTION °Certain foods can decrease the body's absorption of iron. Try to avoid these foods and beverages while eating meals with iron-containing foods: °· Coffee. °· Tea. °· Fiber. °· Soy. °Foods containing vitamin C can help increase the amount of iron your body absorbs from iron sources, especially from nonheme sources. Eat foods with vitamin C along with iron-containing foods to increase your iron absorption. Foods that are high in vitamin C include many fruits and vegetables. Some good sources are: °· Fresh orange juice. °· Oranges. °· Strawberries. °· Mangoes. °· Grapefruit. °· Red bell peppers. °· Green bell peppers. °· Broccoli. °· Potatoes with skin. °· Tomato juice. °Document Released: 02/01/2005 Document Revised: 09/12/2011 Document Reviewed: 12/09/2010 °ExitCare® Patient Information ©2014 [employer]. ° °

## 2013-10-27 NOTE — MAU Note (Signed)
38 yo presents to MAU for iron infusion per signed&held order. Denies pain or acute distress at this time.

## 2014-03-14 ENCOUNTER — Other Ambulatory Visit: Payer: Self-pay | Admitting: Otolaryngology

## 2014-04-28 ENCOUNTER — Other Ambulatory Visit: Payer: Self-pay

## 2014-04-28 DIAGNOSIS — N644 Mastodynia: Secondary | ICD-10-CM

## 2014-04-28 DIAGNOSIS — N6452 Nipple discharge: Secondary | ICD-10-CM

## 2014-05-05 ENCOUNTER — Encounter (HOSPITAL_COMMUNITY): Payer: Self-pay | Admitting: *Deleted

## 2014-10-19 ENCOUNTER — Emergency Department (INDEPENDENT_AMBULATORY_CARE_PROVIDER_SITE_OTHER)
Admission: EM | Admit: 2014-10-19 | Discharge: 2014-10-19 | Disposition: A | Discharge status: Home / Self Care | Payer: 59 | Source: Home / Self Care | Attending: Family Medicine | Admitting: Family Medicine

## 2014-10-19 ENCOUNTER — Encounter (HOSPITAL_COMMUNITY): Payer: Self-pay | Admitting: *Deleted

## 2014-10-19 DIAGNOSIS — R5383 Other fatigue: Secondary | ICD-10-CM

## 2014-10-19 DIAGNOSIS — Z8709 Personal history of other diseases of the respiratory system: Secondary | ICD-10-CM | POA: Diagnosis not present

## 2014-10-19 LAB — POCT URINALYSIS DIP (DEVICE)
BILIRUBIN URINE: NEGATIVE
GLUCOSE, UA: NEGATIVE mg/dL
HGB URINE DIPSTICK: NEGATIVE
KETONES UR: NEGATIVE mg/dL
LEUKOCYTES UA: NEGATIVE
NITRITE: NEGATIVE
PH: 6 (ref 5.0–8.0)
Protein, ur: NEGATIVE mg/dL
Specific Gravity, Urine: >=1.03 (ref 1.005–1.030)
Urobilinogen, UA: 1 mg/dL (ref 0.0–1.0)

## 2014-10-19 LAB — POCT I-STAT, CHEM 8
BUN: 13 mg/dL (ref 6–23)
CALCIUM ION: 1.2 mmol/L (ref 1.12–1.23)
Chloride: 105 mmol/L (ref 96–112)
Creatinine, Ser: 0.9 mg/dL (ref 0.50–1.10)
Glucose, Bld: 76 mg/dL (ref 70–99)
HEMATOCRIT: 37 % (ref 36.0–46.0)
Hemoglobin: 12.6 g/dL (ref 12.0–15.0)
POTASSIUM: 3.9 mmol/L (ref 3.5–5.1)
SODIUM: 140 mmol/L (ref 135–145)
TCO2: 20 mmol/L (ref 0–100)

## 2014-10-19 LAB — POCT PREGNANCY, URINE: Preg Test, Ur: NEGATIVE

## 2014-10-19 MED ORDER — ALBUTEROL SULFATE HFA 108 (90 BASE) MCG/ACT IN AERS: 1.0000 | INHALATION_SPRAY | Freq: Four times a day (QID) | RESPIRATORY_TRACT | Status: DC | PRN

## 2014-10-19 NOTE — Discharge Instructions (Signed)
Fatigue Fatigue is a feeling of tiredness, lack of energy, lack of motivation, or feeling tired all the time. Having enough rest, good nutrition, and reducing stress will normally reduce fatigue. Consult your caregiver if it persists. The nature of your fatigue will help your caregiver to find out its cause. The treatment is based on the cause.  CAUSES  There are many causes for fatigue. Most of the time, fatigue can be traced to one or more of your habits or routines. Most causes fit into one or more of three general areas. They are: Lifestyle problems  Sleep disturbances.  Overwork.  Physical exertion.  Unhealthy habits.  Poor eating habits or eating disorders.  Alcohol and/or drug use .  Lack of proper nutrition (malnutrition). Psychological problems  Stress and/or anxiety problems.  Depression.  Grief.  Boredom. Medical Problems or Conditions  Anemia.  Pregnancy.  Thyroid gland problems.  Recovery from major surgery.  Continuous pain.  Emphysema or asthma that is not well controlled  Allergic conditions.  Diabetes.  Infections (such as mononucleosis).  Obesity.  Sleep disorders, such as sleep apnea.  Heart failure or other heart-related problems.  Cancer.  Kidney disease.  Liver disease.  Effects of certain medicines such as antihistamines, cough and cold remedies, prescription pain medicines, heart and blood pressure medicines, drugs used for treatment of cancer, and some antidepressants. SYMPTOMS  The symptoms of fatigue include:   Lack of energy.  Lack of drive (motivation).  Drowsiness.  Feeling of indifference to the surroundings. DIAGNOSIS  The details of how you feel help guide your caregiver in finding out what is causing the fatigue. You will be asked about your present and past health condition. It is important to review all medicines that you take, including prescription and non-prescription items. A thorough exam will be done.  You will be questioned about your feelings, habits, and normal lifestyle. Your caregiver may suggest blood tests, urine tests, or other tests to look for common medical causes of fatigue.  TREATMENT  Fatigue is treated by correcting the underlying cause. For example, if you have continuous pain or depression, treating these causes will improve how you feel. Similarly, adjusting the dose of certain medicines will help in reducing fatigue.  HOME CARE INSTRUCTIONS   Try to get the required amount of good sleep every night.  Eat a healthy and nutritious diet, and drink enough water throughout the day.  Practice ways of relaxing (including yoga or meditation).  Exercise regularly.  Make plans to change situations that cause stress. Act on those plans so that stresses decrease over time. Keep your work and personal routine reasonable.  Avoid street drugs and minimize use of alcohol.  Start taking a daily multivitamin after consulting your caregiver. SEEK MEDICAL CARE IF:   You have persistent tiredness, which cannot be accounted for.  You have fever.  You have unintentional weight loss.  You have headaches.  You have disturbed sleep throughout the night.  You are feeling sad.  You have constipation.  You have dry skin.  You have gained weight.  You are taking any new or different medicines that you suspect are causing fatigue.  You are unable to sleep at night.  You develop any unusual swelling of your legs or other parts of your body. SEEK IMMEDIATE MEDICAL CARE IF:   You are feeling confused.  Your vision is blurred.  You feel faint or pass out.  You develop severe headache.  You develop severe abdominal, pelvic, or  back pain.  You develop chest pain, shortness of breath, or an irregular or fast heartbeat.  You are unable to pass a normal amount of urine.  You develop abnormal bleeding such as bleeding from the rectum or you vomit blood.  You have thoughts  about harming yourself or committing suicide.  You are worried that you might harm someone else. MAKE SURE YOU:   Understand these instructions.  Will watch your condition.  Will get help right away if you are not doing well or get worse. Document Released: 04/17/2007 Document Revised: 09/12/2011 Document Reviewed: 10/22/2013 [facility] IncExitCare Patient Information 2015 [place], [place]. This information is not intended to replace advice given to you by your health care provider. Make sure you discuss any questions you have with your health care provider.    No emergent cause of your fatigue is identified; as your lab work and urine is normal. Hopefully just a passing virus. If not or worsens please f/u with PCP for more work up./

## 2014-10-19 NOTE — ED Provider Notes (Signed)
CSN: [id number]     Arrival date & time 10/19/14  1559 History   First MD Initiated Contact with Patient 10/19/14 1736     Chief Complaint  Patient presents with  . Fatigue  . Shortness of Breath   (Consider location/radiation/quality/duration/timing/severity/associated sxs/prior Treatment) HPI Comments: Patient presents with a week of "fatigue". She was exposed to walking pna at work and is concerned. She also carries a history of Fe def anemia. She reports mild headaches and mild SOB; though she carries a history of asthma. She denies cough, congestion or wheeze. No fevers or chills. No urinary symptoms. Her only "true" symptom is fatigue. See remainder of ROS.   Patient is a 39 y.o. female presenting with shortness of breath. The history is provided by the patient.  Shortness of Breath Associated symptoms: no fever and no wheezing     Past Medical History  Diagnosis Date  . History of chicken pox   . HA (headache)   . Anemia   . Strep pharyngitis     multiple times   Past Surgical History  Procedure Laterality Date  . Cholecystectomy  2006  . 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 Ectopic pregnancy surgery  1997  . Ovarian cyst surgery    . Gastric bypass    . Tonsillectomy     Family History  Problem Relation Age of Onset  . Hyperlipidemia Mother   . Hypertension Mother   . Diabetes Mother   . Hyperlipidemia Father   . Hypertension Father   . Diabetes Father   . Diabetes Maternal Grandmother   . Diabetes Maternal Grandfather   . Diabetes Paternal Grandmother   . Diabetes Paternal Grandfather   . HIV Father    History  Substance Use Topics  . Smoking status: Current Every Day Smoker -- 0.00 packs/day  . Smokeless tobacco: Former Neurosurgeon  . Alcohol Use: Yes     Comment: occasional   OB History    Gravida Para Term Preterm AB TAB SAB Ectopic Multiple Living   Review of Systems  Constitutional: Positive for fatigue. Negative for fever, chills and  unexpected weight change.  HENT: Negative for congestion, postnasal drip, rhinorrhea and sinus pressure.   Eyes: Negative.   Respiratory: Positive for shortness of breath. Negative for wheezing.   Gastrointestinal: Negative.   Endocrine: Negative.   Genitourinary: Negative.   Musculoskeletal: Negative.   Skin: Negative.   Allergic/Immunologic: Positive for environmental allergies.  Neurological: Negative.   Psychiatric/Behavioral: Negative.     Allergies  Other  Home Medications   Prior to Admission medications   Medication Sig Start Date End Date Taking? Authorizing Provider  albuterol (PROVENTIL HFA;VENTOLIN HFA) 108 (90 BASE) MCG/ACT inhaler Inhale 1-2 puffs into the lungs every 6 (six) hours as needed for wheezing or shortness of breath. 10/19/14   Riki Sheer, PA-C  PRESCRIPTION MEDICATION Take 1 tablet by mouth daily. Patient says she was taking something for a root canal but pharmacy did not have a record of it    Historical Provider, MD   BP 123/81 mmHg  Pulse 90  Temp(Src) 98.7 F (37.1 C) (Oral)  Resp 16  SpO2 98%  LMP 10/03/2014 (Exact Date) Physical Exam  Constitutional: She is oriented to person, place, and time. She appears well-developed and well-nourished. No distress.  Non-toxic and without acute distress, good pallor  HENT:  Head: Normocephalic and atraumatic.  Mouth/Throat: No oropharyngeal  exudate.  Eyes: Pupils are equal, round, and reactive to light. Right eye exhibits no discharge. Left eye exhibits no discharge. No scleral icterus.  Neck: Normal range of motion.  Cardiovascular: Normal rate and regular rhythm.   No murmur heard. Pulmonary/Chest: Effort normal and breath sounds normal. No respiratory distress. She has no wheezes. She has no rales.  Musculoskeletal: Normal range of motion.  Lymphadenopathy:    She has no cervical adenopathy.  Neurological: She is alert and oriented to person, place, and time.  Skin: Skin is warm and dry. She is  not diaphoretic. No erythema.  Psychiatric: Her behavior is normal.  Nursing note and vitals reviewed.   ED Course  Procedures (including critical care time) Labs Review Labs Reviewed  POCT I-STAT, CHEM 8  POCT URINALYSIS DIP (DEVICE)  POCT PREGNANCY, URINE    Imaging Review No results found.   MDM   1. Other fatigue   2. History of asthma    1. No emergent cause of her fatigue is noted today. Labs/Urine normal. Exam non-revealing. Suspect virus, but if continues recommend f/u with PCP for more comprehensive evaluation.  2. Refilled Proventil until f/u with PCP. No acute asthma noted today.     Riki SheerMichelle G Young, PA-C 10/19/14 [phone]

## 2014-10-19 NOTE — ED Notes (Signed)
Pt c/o weakness, fatigue, decreased energy, body soreness, HAs and SOB x 2 days.  States she is out of inhaler.  Is concerned about walking pneumonia, as a coworker has it; also concerned about possible low iron levels.  Denies fevers.  BBS clear.

## 2014-10-19 NOTE — ED Notes (Signed)
Called patient and patient did not answer. 

## 2014-10-23 ENCOUNTER — Telehealth: Payer: Self-pay | Admitting: Family

## 2014-10-23 ENCOUNTER — Ambulatory Visit: Payer: 59 | Admitting: Primary Care

## 2014-10-23 DIAGNOSIS — Z0289 Encounter for other administrative examinations: Secondary | ICD-10-CM

## 2014-10-23 NOTE — Telephone Encounter (Signed)
Yes, please contact to reschedule.   Thanks.

## 2014-10-23 NOTE — Telephone Encounter (Signed)
Patient did not come in for their appointment today for new pt appt.  Please let me know if patient needs to be contacted immediately for follow up or no follow up needed. °

## 2014-10-23 NOTE — Telephone Encounter (Signed)
Pt is traveling for work and will call back to reschedule when she gets back.

## 2015-02-26 ENCOUNTER — Other Ambulatory Visit (HOSPITAL_COMMUNITY)
Admission: RE | Admit: 2015-02-26 | Discharge: 2015-02-26 | Disposition: A | Discharge status: Home / Self Care | Payer: 59 | Source: Ambulatory Visit | Attending: Primary Care | Admitting: Primary Care

## 2015-02-26 ENCOUNTER — Ambulatory Visit (INDEPENDENT_AMBULATORY_CARE_PROVIDER_SITE_OTHER): Payer: 59 | Admitting: Primary Care

## 2015-02-26 ENCOUNTER — Encounter: Payer: Self-pay | Admitting: Primary Care

## 2015-02-26 VITALS — BP 116/64 | HR 77 | Temp 98.5°F | Ht 66.0 in | Wt 174.0 lb

## 2015-02-26 DIAGNOSIS — Z0000 Encounter for general adult medical examination without abnormal findings: Secondary | ICD-10-CM

## 2015-02-26 DIAGNOSIS — Z113 Encounter for screening for infections with a predominantly sexual mode of transmission: Secondary | ICD-10-CM | POA: Diagnosis present

## 2015-02-26 DIAGNOSIS — D509 Iron deficiency anemia, unspecified: Secondary | ICD-10-CM | POA: Diagnosis not present

## 2015-02-26 DIAGNOSIS — M546 Pain in thoracic spine: Secondary | ICD-10-CM

## 2015-02-26 DIAGNOSIS — Z1151 Encounter for screening for human papillomavirus (HPV): Secondary | ICD-10-CM | POA: Diagnosis not present

## 2015-02-26 DIAGNOSIS — Z01419 Encounter for gynecological examination (general) (routine) without abnormal findings: Secondary | ICD-10-CM | POA: Diagnosis present

## 2015-02-26 DIAGNOSIS — K219 Gastro-esophageal reflux disease without esophagitis: Secondary | ICD-10-CM | POA: Insufficient documentation

## 2015-02-26 DIAGNOSIS — N62 Hypertrophy of breast: Secondary | ICD-10-CM

## 2015-02-26 LAB — CBC
HEMATOCRIT: 31.2 % — AB (ref 36.0–46.0)
Hemoglobin: 9.8 g/dL — ABNORMAL LOW (ref 12.0–15.0)
MCHC: 31.3 g/dL (ref 30.0–36.0)
MCV: 83.1 fl (ref 78.0–100.0)
PLATELETS: 414 10*3/uL — AB (ref 150.0–400.0)
RBC: 3.75 Mil/uL — AB (ref 3.87–5.11)
RDW: 16.6 % — ABNORMAL HIGH (ref 11.5–15.5)
WBC: 7.4 10*3/uL (ref 4.0–10.5)

## 2015-02-26 LAB — LIPID PANEL
CHOL/HDL RATIO: 3
Cholesterol: 157 mg/dL (ref 0–200)
HDL: 51.7 mg/dL (ref >39.00)
LDL Cholesterol: 97 mg/dL (ref 0–99)
NONHDL: 104.82
Triglycerides: 41 mg/dL (ref 0.0–149.0)
VLDL: 8.2 mg/dL (ref 0.0–40.0)

## 2015-02-26 LAB — COMPREHENSIVE METABOLIC PANEL
ALBUMIN: 4.1 g/dL (ref 3.5–5.2)
ALT: 10 U/L (ref 0–35)
AST: 14 U/L (ref 0–37)
Alkaline Phosphatase: 37 U/L — ABNORMAL LOW (ref 39–117)
BUN: 14 mg/dL (ref 6–23)
CALCIUM: 9.6 mg/dL (ref 8.4–10.5)
CHLORIDE: 106 meq/L (ref 96–112)
CO2: 27 meq/L (ref 19–32)
Creatinine, Ser: 0.94 mg/dL (ref 0.40–1.20)
GFR: 85.16 mL/min (ref >60.00)
Glucose, Bld: 80 mg/dL (ref 70–99)
POTASSIUM: 4.8 meq/L (ref 3.5–5.1)
Sodium: 141 mEq/L (ref 135–145)
Total Bilirubin: 0.3 mg/dL (ref 0.2–1.2)
Total Protein: 6.9 g/dL (ref 6.0–8.3)

## 2015-02-26 LAB — HEMOGLOBIN A1C: HEMOGLOBIN A1C: 5.7 % (ref 4.6–6.5)

## 2015-02-26 LAB — IBC PANEL
IRON: 13 ug/dL — AB (ref 42–145)
SATURATION RATIOS: 2.6 % — AB (ref 20.0–50.0)
TRANSFERRIN: 362 mg/dL — AB (ref 212.0–360.0)

## 2015-02-26 LAB — TSH: TSH: 1.81 u[IU]/mL (ref 0.35–4.50)

## 2015-02-26 LAB — VITAMIN B12: Vitamin B-12: 157 pg/mL — ABNORMAL LOW (ref 211–911)

## 2015-02-26 LAB — FERRITIN: FERRITIN: 2.9 ng/mL — AB (ref 10.0–291.0)

## 2015-02-26 MED ORDER — OMEPRAZOLE 20 MG PO CPDR: 20.0000 mg | DELAYED_RELEASE_CAPSULE | Freq: Every day | ORAL | Status: AC

## 2015-02-26 NOTE — Assessment & Plan Note (Signed)
Tetanus up to date per patient, will review old records. Pap completed today. Declines flu shot. Discussed the importance of reducing carbs and sugar in her diet. Discussed the importance of regular exercise. Exam unremarkable. Labs pending.

## 2015-02-26 NOTE — Progress Notes (Signed)
Pre visit review using our clinic review tool, if applicable. No additional management support is needed unless otherwise documented below in the visit note. 

## 2015-02-26 NOTE — Assessment & Plan Note (Signed)
Endorses long standing history of. Last iron infusion was April 2015. Reports of fatigue over the years, will check CBC, iron panels, vitamin B12 today.
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
## 2015-02-26 NOTE — Patient Instructions (Addendum)
Complete lab work prior to leaving today. I will notify you of your results.  Start omeprazole tablets for acid reflux. Take 1 tablet by mouth daily.  It is important that you improve your diet. Please limit carbohydrates in the form of white bread, rice, pasta, cakes, cookies, sugary drinks, etc. Increase your consumption of fresh fruits and vegetables. Be sure to drink plenty of water daily.  Stop by the front and speak with Shirlee Limerick regarding your referral to plastic surgery.  It was a pleasure to meet you today! Please don't hesitate to call me with any questions. Welcome to [facility]!

## 2015-02-26 NOTE — Assessment & Plan Note (Signed)
Symptoms of reflux of gastric contents x 3 years. Not currently managed on medication. Tried omeprazole samples with relief. Will send omeprazole 20 mg to pharmacy. Discussed trigger foods.

## 2015-02-26 NOTE — Assessment & Plan Note (Signed)
Present for years, suspect this is due to large breast size. Gastric bypass in 2007, now with moderate amount of residual skin with sagging breasts. Indention noted to bilateral shoulders from bra strap. Referral made to plastic surgery for further eval.

## 2015-02-27 ENCOUNTER — Other Ambulatory Visit: Payer: Self-pay | Admitting: Primary Care

## 2015-02-27 DIAGNOSIS — D509 Iron deficiency anemia, unspecified: Secondary | ICD-10-CM

## 2015-02-27 LAB — CYTOLOGY - PAP

## 2015-02-27 LAB — HIV ANTIBODY (ROUTINE TESTING W REFLEX): HIV: NONREACTIVE

## 2015-03-02 ENCOUNTER — Encounter: Payer: Self-pay | Admitting: *Deleted

## 2015-03-02 ENCOUNTER — Other Ambulatory Visit: Payer: Self-pay | Admitting: Primary Care

## 2015-03-02 DIAGNOSIS — B3731 Acute candidiasis of vulva and vagina: Secondary | ICD-10-CM

## 2015-03-02 DIAGNOSIS — B373 Candidiasis of vulva and vagina: Secondary | ICD-10-CM

## 2015-03-02 MED ORDER — FLUCONAZOLE 150 MG PO TABS: ORAL_TABLET | ORAL | Status: AC

## 2015-03-03 ENCOUNTER — Ambulatory Visit (INDEPENDENT_AMBULATORY_CARE_PROVIDER_SITE_OTHER): Payer: 59 | Admitting: *Deleted

## 2015-03-03 ENCOUNTER — Other Ambulatory Visit: Payer: Self-pay | Admitting: Primary Care

## 2015-03-03 ENCOUNTER — Telehealth: Payer: Self-pay | Admitting: Primary Care

## 2015-03-03 DIAGNOSIS — D519 Vitamin B12 deficiency anemia, unspecified: Secondary | ICD-10-CM

## 2015-03-03 DIAGNOSIS — Z8639 Personal history of other endocrine, nutritional and metabolic disease: Secondary | ICD-10-CM

## 2015-03-03 MED ORDER — CYANOCOBALAMIN 1000 MCG/ML IJ SOLN
1000.0000 ug | Freq: Once | INTRAMUSCULAR | Status: AC
  Administered 2015-03-03: 1000 ug via INTRAMUSCULAR

## 2015-03-03 NOTE — Telephone Encounter (Signed)
Please notify Robin Kent that I will be glad to check her vitamin D level, however I do not believe there is need to check any specific antibodies. Will you get her scheduled for lab at her convenience.

## 2015-03-03 NOTE — Telephone Encounter (Signed)
Called patient back. Patient stated a while back, she was taking Rx for Vitamin D weekly so she would like to get check again. Asked about antibodies. Patient stated she does not know which one, she then stated all of them.

## 2015-03-03 NOTE — Telephone Encounter (Signed)
Patient returned Chan's call.  Please call her back at [phone].

## 2015-03-03 NOTE — Telephone Encounter (Signed)
I may be able to add on the Vitamin D levels, does she have a prior history of? What antibodies is she referring to?

## 2015-03-03 NOTE — Telephone Encounter (Signed)
Pt would like her Vit.D and antibodies labs done. The best number to contact pt back at is [phone].

## 2015-03-03 NOTE — Telephone Encounter (Signed)
I left a message for the patient to return my call.

## 2015-03-04 NOTE — Telephone Encounter (Signed)
Called and notified patient of Kate's comments. Patient verbalized understanding. Lab apt schedule on 03/05/15.

## 2015-03-05 ENCOUNTER — Other Ambulatory Visit: Payer: 59

## 2015-03-11 ENCOUNTER — Encounter (INDEPENDENT_AMBULATORY_CARE_PROVIDER_SITE_OTHER): Payer: Self-pay

## 2015-03-11 ENCOUNTER — Inpatient Hospital Stay: Payer: 59 | Attending: Oncology | Admitting: Oncology

## 2015-03-11 ENCOUNTER — Encounter: Payer: Self-pay | Admitting: Oncology

## 2015-03-11 VITALS — BP 98/61 | HR 89 | Temp 98.7°F | Resp 16 | Ht 66.93 in | Wt 173.9 lb

## 2015-03-11 DIAGNOSIS — Z79899 Other long term (current) drug therapy: Secondary | ICD-10-CM | POA: Diagnosis not present

## 2015-03-11 DIAGNOSIS — F17210 Nicotine dependence, cigarettes, uncomplicated: Secondary | ICD-10-CM | POA: Diagnosis not present

## 2015-03-11 DIAGNOSIS — D509 Iron deficiency anemia, unspecified: Secondary | ICD-10-CM | POA: Diagnosis not present

## 2015-03-11 NOTE — Progress Notes (Signed)
Patient has history of anemia and has received Feraheme infusions in 2014.

## 2015-03-12 ENCOUNTER — Inpatient Hospital Stay: Payer: 59

## 2015-03-12 ENCOUNTER — Telehealth: Payer: Self-pay | Admitting: Pharmacist

## 2015-03-12 VITALS — BP 104/66 | HR 76 | Temp 98.0°F | Resp 18

## 2015-03-12 DIAGNOSIS — D509 Iron deficiency anemia, unspecified: Secondary | ICD-10-CM

## 2015-03-12 MED ORDER — SODIUM CHLORIDE 0.9 % IV SOLN
Freq: Once | INTRAVENOUS | Status: AC
  Administered 2015-03-12: 15:00:00 via INTRAVENOUS
  Filled 2015-03-12: qty 1000

## 2015-03-12 MED ORDER — SODIUM CHLORIDE 0.9 % IV SOLN
510.0000 mg | Freq: Once | INTRAVENOUS | Status: AC
  Administered 2015-03-12: 510 mg via INTRAVENOUS
  Filled 2015-03-12: qty 17

## 2015-03-12 NOTE — Progress Notes (Signed)
Patient stated that she was not able to stay after feraheme  infusion.  She has to get her child from school.  Released patient per her request. Patient says she feels fine.

## 2015-03-12 NOTE — Telephone Encounter (Signed)
Spoke with Robin Kent regarding authorization for fereheme. According to her it should not be a problem. Patient has personal issues that require the iron infusion today.

## 2015-03-17 ENCOUNTER — Telehealth: Payer: Self-pay | Admitting: Primary Care

## 2015-03-17 NOTE — Telephone Encounter (Signed)
Pt called wanting kate to call her regarding To let her know whats going on with her pt would not go into detail about whats going on

## 2015-03-17 NOTE — Telephone Encounter (Signed)
Spoke with patient regarding concerns. 

## 2015-03-18 NOTE — Progress Notes (Signed)
[facility]  [phone] [phone]  ID: Robin Kent OB: May 27, 1976  MR#: [id number]  AOZ#:[id number]  Patient Care Team: Doreene Nest, NP as PCP - General (Nurse Practitioner)  CHIEF COMPLAINT:  Chief Complaint  Patient presents with  . New Evaluation    anemia    INTERVAL HISTORY: Patient is a 39 year old female with a reported long-standing history of iron deficiency anemia. Patient has received iron infusions in the past. Currently, she feels weak and fatigued but otherwise feels well. She has no neurologic complaints. She denies any recent fevers. She has a good appetite and denies weight loss. She has no chest pain or shortness of breath. She denies any nausea, vomiting, constipation, or diarrhea. She has no urinary complaints. Patient otherwise feels well and offers no further specific complaints.  REVIEW OF SYSTEMS:   Review of Systems  Constitutional: Positive for malaise/fatigue.  Respiratory: Negative.   Cardiovascular: Negative.   Gastrointestinal: Negative for blood in stool and melena.  Neurological: Positive for weakness.    As per HPI. Otherwise, a complete review of systems is negatve.  PAST MEDICAL HISTORY: Past Medical History  Diagnosis Date  . History of chicken pox   . HA (headache)   . Anemia   . Strep pharyngitis     multiple times    PAST SURGICAL HISTORY: Past Surgical History  Procedure Laterality Date  . Cholecystectomy  2006  . 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 Ectopic pregnancy surgery  1997  . Ovarian cyst surgery    . Gastric bypass    . Tonsillectomy      FAMILY HISTORY Family History  Problem Relation Age of Onset  . Hyperlipidemia Mother   . Hypertension Mother   . Diabetes Mother   . Hyperlipidemia Father   . Hypertension Father   . Diabetes Father   . Diabetes Maternal Grandmother   . Diabetes Maternal Grandfather   . Diabetes Paternal Grandmother   . Diabetes Paternal  Grandfather   . HIV Father        ADVANCED DIRECTIVES:    HEALTH MAINTENANCE: Social History  Substance Use Topics  . Smoking status: Current Every Day Smoker -- 0.00 packs/day for 15 years    Types: Cigarettes  . Smokeless tobacco: Former Neurosurgeon  . Alcohol Use: Yes     Comment: occasional     Colonoscopy:  PAP:  Bone density:  Lipid panel:  Allergies  Allergen Reactions  . Broccoli [Brassica Oleracea Italica] Hives  . Other Hives    Tomatoes     Current Outpatient Prescriptions  Medication Sig Dispense Refill  . fluconazole (DIFLUCAN) 150 MG tablet Take 1 tablet by mouth once. May repeat in 3 days if symptoms do not improve. (Patient not taking: Reported on 03/11/2015) 2 tablet 0  . omeprazole (PRILOSEC) 20 MG capsule Take 1 capsule (20 mg total) by mouth daily. (Patient not taking: Reported on 03/11/2015) 30 capsule 3   No current facility-administered medications for this visit.    OBJECTIVE: Filed Vitals:   03/11/15 1543  BP: 98/61  Pulse: 89  Temp: 98.7 F (37.1 C)  Resp: 16     Body mass index is 27.3 kg/(m^2).    ECOG FS:0 - Asymptomatic  General: Well-developed, well-nourished, no acute distress. Eyes: Pink conjunctiva, anicteric sclera. HEENT: Normocephalic, moist mucous membranes, clear oropharnyx. Lungs: Clear to auscultation bilaterally. Heart: Regular rate and rhythm. No rubs, murmurs, or gallops. Abdomen: Soft, nontender, nondistended. No organomegaly noted, normoactive bowel sounds.  Musculoskeletal: No edema, cyanosis, or clubbing. Neuro: Alert, answering all questions appropriately. Cranial nerves grossly intact. Skin: No rashes or petechiae noted. Psych: Normal affect. Lymphatics: No cervical, calvicular, axillary or inguinal LAD.   LAB RESULTS:  Lab Results  Component Value Date   NA 141 02/26/2015   K 4.8 02/26/2015   CL 106 02/26/2015   CO2 27 02/26/2015   GLUCOSE 80 02/26/2015   BUN 14 02/26/2015   CREATININE 0.94 02/26/2015    CALCIUM 9.6 02/26/2015   PROT 6.9 02/26/2015   ALBUMIN 4.1 02/26/2015   AST 14 02/26/2015   ALT 10 02/26/2015   ALKPHOS 37* 02/26/2015   BILITOT 0.3 02/26/2015    Lab Results  Component Value Date   WBC 7.4 02/26/2015   NEUTROABS 6.4 04/30/2008   HGB 9.8* 02/26/2015   HCT 31.2* 02/26/2015   MCV 83.1 02/26/2015   PLT 414.0* 02/26/2015     STUDIES: No results found.  ASSESSMENT: Iron deficiency anemia.  PLAN:    1. Iron deficiency anemia: Patient's iron stores and hemoglobin are significantly decreased. Patient states she lives in [place] and travels back and forth to [place] to see her children. She is leaving Friday for home. It was recommended she have 2 infusions of Feraheme, but patient refuses. She will return tomorrow for one infusion of 510 mg IV Feraheme. Because of patient's travel schedule, no follow-up has been scheduled at this time. Have instructed patient to call when she is in [place] for repeat laboratory work, further evaluation, and consideration of additional IV iron.  Patient expressed understanding and was in agreement with this plan. She also understands that She can call clinic at any time with any questions, concerns, or complaints.   Jeralyn Ruths, MD   03/18/2015 9:16 AM

## 2015-03-19 ENCOUNTER — Telehealth: Payer: Self-pay | Admitting: Primary Care

## 2015-03-19 NOTE — Telephone Encounter (Signed)
[place] is [place] right now, and is having a period that is lasting 11 days.  She states that it is more brown like than red blood.  Pt request call back at [phone]

## 2015-03-19 NOTE — Telephone Encounter (Addendum)
Called and notified patient of Kate's comments. Patient verbalized understanding. Patient stated that it is not heavy some spotting and it is brownish in color. Only use 1 pad a day. Repeated Kate's comments. Patient then stated she is on another line  And hung up the phone.

## 2015-03-19 NOTE — Telephone Encounter (Signed)
Is her period heavy? How many tampons/pads is she going through daily? If she's experiencing heavy bleeding with weakness and fatigue she needs to be evaluated very soon. She may be seen at an Urgent care or emergency department if she's out of the state.

## 2015-05-01 ENCOUNTER — Other Ambulatory Visit: Payer: Self-pay | Admitting: Oncology

## 2015-05-01 DIAGNOSIS — D509 Iron deficiency anemia, unspecified: Secondary | ICD-10-CM

## 2015-05-04 ENCOUNTER — Inpatient Hospital Stay: Payer: 59 | Attending: Oncology | Admitting: Oncology

## 2015-05-04 ENCOUNTER — Inpatient Hospital Stay: Payer: 59

## 2015-05-04 ENCOUNTER — Ambulatory Visit (INDEPENDENT_AMBULATORY_CARE_PROVIDER_SITE_OTHER): Payer: 59 | Admitting: *Deleted

## 2015-05-04 ENCOUNTER — Ambulatory Visit: Payer: 59

## 2015-05-04 ENCOUNTER — Ambulatory Visit: Payer: 59 | Admitting: Primary Care

## 2015-05-04 DIAGNOSIS — E538 Deficiency of other specified B group vitamins: Secondary | ICD-10-CM | POA: Diagnosis not present

## 2015-05-04 DIAGNOSIS — D509 Iron deficiency anemia, unspecified: Secondary | ICD-10-CM

## 2015-05-04 LAB — CBC WITH DIFFERENTIAL/PLATELET
BASOS ABS: 0 10*3/uL (ref 0–0.1)
BASOS PCT: 1 %
EOS ABS: 0.1 10*3/uL (ref 0–0.7)
EOS PCT: 3 %
HCT: 37.4 % (ref 35.0–47.0)
Hemoglobin: 12 g/dL (ref 12.0–16.0)
Lymphocytes Relative: 45 %
Lymphs Abs: 2.4 10*3/uL (ref 1.0–3.6)
MCH: 27 pg (ref 26.0–34.0)
MCHC: 32 g/dL (ref 32.0–36.0)
MCV: 84.2 fL (ref 80.0–100.0)
MONO ABS: 0.3 10*3/uL (ref 0.2–0.9)
MONOS PCT: 6 %
NEUTROS ABS: 2.3 10*3/uL (ref 1.4–6.5)
Neutrophils Relative %: 45 %
PLATELETS: 309 10*3/uL (ref 150–440)
RBC: 4.44 MIL/uL (ref 3.80–5.20)
RDW: 19.7 % — AB (ref 11.5–14.5)
WBC: 5.2 10*3/uL (ref 3.6–11.0)

## 2015-05-04 LAB — IRON AND TIBC
IRON: 77 ug/dL (ref 28–170)
Saturation Ratios: 22 % (ref 10.4–31.8)
TIBC: 343 ug/dL (ref 250–450)
UIBC: 266 ug/dL

## 2015-05-04 LAB — FERRITIN: FERRITIN: 29 ng/mL (ref 11–307)

## 2015-05-04 MED ORDER — CYANOCOBALAMIN 1000 MCG/ML IJ SOLN
1000.0000 ug | Freq: Once | INTRAMUSCULAR | Status: AC
  Administered 2015-05-04: 1000 ug via INTRAMUSCULAR

## 2015-05-04 NOTE — Progress Notes (Signed)
This encounter was created in error - please disregard.

## 2015-10-12 ENCOUNTER — Telehealth: Payer: Self-pay | Admitting: Primary Care

## 2015-10-12 ENCOUNTER — Encounter: Payer: 59 | Admitting: Primary Care

## 2015-10-12 DIAGNOSIS — Z0289 Encounter for other administrative examinations: Secondary | ICD-10-CM

## 2015-10-12 NOTE — Telephone Encounter (Signed)
Patient did not come for their scheduled appointment today for cpx  Please let me know if the patient needs to be contacted immediately for follow up or if no follow up is necessary.   ° °

## 2015-10-12 NOTE — Telephone Encounter (Signed)
No urgent follow up necessary.
